# Patient Record
Sex: Male | Born: 1935 | Race: White | Hispanic: No | Marital: Married | Smoking: Former smoker
Health system: Southern US, Community
[De-identification: ages and names within clinical notes are randomized; demographics above are authoritative.]

## PROBLEM LIST (undated history)

## (undated) DIAGNOSIS — I2510 Atherosclerotic heart disease of native coronary artery without angina pectoris: Secondary | ICD-10-CM

## (undated) HISTORY — PX: HERNIA REPAIR: SHX51

## (undated) HISTORY — PX: STENT PLACEMENT VASCULAR (ARMC HX): HXRAD1737

---

## 2017-08-22 ENCOUNTER — Encounter: Payer: Self-pay | Admitting: Emergency Medicine

## 2017-08-22 ENCOUNTER — Emergency Department (INDEPENDENT_AMBULATORY_CARE_PROVIDER_SITE_OTHER)
Admission: EM | Admit: 2017-08-22 | Discharge: 2017-08-22 | Disposition: A | Discharge status: Home / Self Care | Payer: Self-pay | Source: Home / Self Care | Attending: Family Medicine | Admitting: Family Medicine

## 2017-08-22 DIAGNOSIS — S76109A Unspecified injury of unspecified quadriceps muscle, fascia and tendon, initial encounter: Secondary | ICD-10-CM

## 2017-08-22 HISTORY — DX: Atherosclerotic heart disease of native coronary artery without angina pectoris: I25.10
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
## 2017-08-22 NOTE — Discharge Instructions (Signed)
Wear Ace wrap until swelling improves
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
## 2017-09-07 ENCOUNTER — Ambulatory Visit (INDEPENDENT_AMBULATORY_CARE_PROVIDER_SITE_OTHER): Payer: Self-pay

## 2017-09-07 ENCOUNTER — Other Ambulatory Visit: Payer: Self-pay | Admitting: Gerontology

## 2017-09-07 DIAGNOSIS — R52 Pain, unspecified: Secondary | ICD-10-CM

## 2017-09-07 DIAGNOSIS — S82831A Other fracture of upper and lower end of right fibula, initial encounter for closed fracture: Secondary | ICD-10-CM

## 2017-09-07 DIAGNOSIS — W228XXA Striking against or struck by other objects, initial encounter: Secondary | ICD-10-CM
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
